# Patient Record
Sex: Male | Born: 1935 | Race: White | Hispanic: No | Marital: Married | Smoking: Former smoker
Health system: Southern US, Community
[De-identification: ages and names within clinical notes are randomized; demographics above are authoritative.]

## PROBLEM LIST (undated history)

## (undated) DIAGNOSIS — E119 Type 2 diabetes mellitus without complications: Secondary | ICD-10-CM

## (undated) DIAGNOSIS — I10 Essential (primary) hypertension: Secondary | ICD-10-CM

## (undated) HISTORY — PX: CORONARY ARTERY BYPASS GRAFT: SHX141

## (undated) HISTORY — PX: CHOLECYSTECTOMY: SHX55

## (undated) HISTORY — PX: BACK SURGERY: SHX140

## (undated) HISTORY — PX: CARDIAC DEFIBRILLATOR PLACEMENT: SHX171

---

## 2008-08-23 ENCOUNTER — Ambulatory Visit: Payer: Self-pay | Admitting: Occupational Medicine

## 2009-02-16 DIAGNOSIS — E119 Type 2 diabetes mellitus without complications: Secondary | ICD-10-CM | POA: Insufficient documentation

## 2013-09-03 DIAGNOSIS — I4891 Unspecified atrial fibrillation: Secondary | ICD-10-CM | POA: Insufficient documentation

## 2015-09-13 DIAGNOSIS — J301 Allergic rhinitis due to pollen: Secondary | ICD-10-CM | POA: Insufficient documentation

## 2015-10-19 ENCOUNTER — Emergency Department
Admission: EM | Admit: 2015-10-19 | Discharge: 2015-10-19 | Payer: Medicare HMO | Source: Home / Self Care | Attending: Family Medicine | Admitting: Family Medicine

## 2015-10-19 ENCOUNTER — Encounter: Payer: Self-pay | Admitting: *Deleted

## 2015-10-19 ENCOUNTER — Emergency Department (INDEPENDENT_AMBULATORY_CARE_PROVIDER_SITE_OTHER): Payer: Medicare HMO

## 2015-10-19 DIAGNOSIS — I5022 Chronic systolic (congestive) heart failure: Secondary | ICD-10-CM | POA: Insufficient documentation

## 2015-10-19 DIAGNOSIS — R0902 Hypoxemia: Secondary | ICD-10-CM

## 2015-10-19 DIAGNOSIS — W19XXXA Unspecified fall, initial encounter: Secondary | ICD-10-CM

## 2015-10-19 DIAGNOSIS — S2242XA Multiple fractures of ribs, left side, initial encounter for closed fracture: Secondary | ICD-10-CM | POA: Diagnosis not present

## 2015-10-19 DIAGNOSIS — D696 Thrombocytopenia, unspecified: Secondary | ICD-10-CM | POA: Insufficient documentation

## 2015-10-19 DIAGNOSIS — R2681 Unsteadiness on feet: Secondary | ICD-10-CM | POA: Insufficient documentation

## 2015-10-19 HISTORY — DX: Type 2 diabetes mellitus without complications: E11.9

## 2015-10-19 HISTORY — DX: Essential (primary) hypertension: I10

## 2015-10-19 MED ORDER — IPRATROPIUM-ALBUTEROL 0.5-2.5 (3) MG/3ML IN SOLN
3.0000 mL | Freq: Once | RESPIRATORY_TRACT | Status: AC
  Administered 2015-10-19: 3 mL via RESPIRATORY_TRACT

## 2015-10-19 NOTE — ED Notes (Signed)
Lost his balance this am in bathroom hit the toilet hurting left ribs, mid-back left arm and hand. Crawled to bed called son who came over and helped him up.

## 2015-10-19 NOTE — ED Provider Notes (Signed)
CSN: [id number]     Arrival date & time 10/19/15  1424 History   First MD Initiated Contact with Patient 10/19/15 1432     Chief Complaint  Patient presents with  . Fall   (Consider location/radiation/quality/duration/timing/severity/associated sxs/prior Treatment) HPI The pt is an 82yo male brought to [facility] by his wife and granddaughter with c/o Left sided rib and back pain after trip and fall this morning while in the bathroom. Pt states he was looking out the window and lost his balance while taking a step back. He fell between the sink and the toilet, hitting his ribs on the toilet. Denies hitting his head or LOC. He was able to crawl to his bed and called his son who came over to help. Pain is aching and sharp, 5/10, worse with palpation, certain movements and deep inspiration.  He has a hx of CHF but denies SOB, leg swelling, or recent weight gain. Denies anterior chest pain.  He is not on oxygen at home. He does have a defibrillator in place.    O2 Sat noted to be 89% on RA.  Pt denies SOB and states he does not use oxygen at home.   Cardiologist: [facility]- Dr. Landry Corporal  Past Medical History  Diagnosis Date  . Diabetes mellitus without complication (HCC)   . Hypertension    Past Surgical History  Procedure Laterality Date  . Back surgery    . Cholecystectomy    . Cardiac defibrillator placement    . Coronary artery bypass graft     History reviewed. No pertinent family history. Social History  Substance Use Topics  . Smoking status: Former Games developer  . Smokeless tobacco: None  . Alcohol Use: No    Review of Systems  Respiratory: Positive for wheezing ( chronic). Negative for cough, chest tightness and shortness of breath.   Cardiovascular: Positive for chest pain (Left side/ribs). Negative for palpitations and leg swelling.  Gastrointestinal: Negative for nausea, vomiting and abdominal pain.  Musculoskeletal: Positive for myalgias, back pain (Right middle/upper) and gait  problem (chronic, unsteady gait). Negative for joint swelling, neck pain and neck stiffness.  Skin: Positive for color change. Negative for wound.    Allergies  Codeine and Penicillins  Home Medications   Prior to Admission medications   Medication Sig Start Date End Date Taking? Authorizing Provider  amiodarone (PACERONE) 200 MG tablet Take 200 mg by mouth daily.   Yes Historical Provider, MD  aspirin EC 81 MG tablet Take 81 mg by mouth daily.   Yes Historical Provider, MD  Cyanocobalamin (VITAMIN B12 PO) Take 1,000 Units by mouth.   Yes Historical Provider, MD  donepezil (ARICEPT) 5 MG tablet Take 5 mg by mouth at bedtime.   Yes Historical Provider, MD  escitalopram (LEXAPRO) 10 MG tablet Take 10 mg by mouth daily.   Yes Historical Provider, MD  furosemide (LASIX) 80 MG tablet Take 80 mg by mouth.   Yes Historical Provider, MD  insulin glargine (LANTUS) 100 UNIT/ML injection Inject 30 Units into the skin at bedtime.   Yes Historical Provider, MD  levothyroxine (SYNTHROID) 88 MCG tablet Take 88 mcg by mouth daily before breakfast.   Yes Historical Provider, MD  metoprolol (LOPRESSOR) 100 MG tablet Take 100 mg by mouth 2 (two) times daily.   Yes Historical Provider, MD  VITAMIN D, CHOLECALCIFEROL, PO Take 1,000 Units by mouth.   Yes Historical Provider, MD   Meds Ordered and Administered this Visit   Medications  ipratropium-albuterol (  DUONEB) 0.5-2.5 (3) MG/3ML nebulizer solution 3 mL (3 mLs Nebulization Given 10/19/15 1607)    BP 132/72 mmHg  Pulse 60  Temp(Src) 97.8 F (36.6 C) (Oral)  Ht 6' (1.829 m)  Wt 204 lb (92.534 kg)  BMI 27.66 kg/m2  SpO2 89% No data found.   Physical Exam  Constitutional: He is oriented to person, place, and time. He appears well-developed and well-nourished.  Elderly male sitting in a wheelchair, NAD  HENT:  Head: Normocephalic and atraumatic.  Mouth/Throat: Oropharynx is clear and moist.  Eyes: Conjunctivae and EOM are normal. Pupils are  equal, round, and reactive to light. No scleral icterus.  Neck: Normal range of motion.  Cardiovascular: Normal rate, regular rhythm and normal heart sounds.   Pulmonary/Chest: Effort normal. No respiratory distress. He has decreased breath sounds. He has wheezes. He has rales in the right lower field and the left lower field.   He exhibits tenderness.    Decreased lung sounds throughout with expiratory wheeze and faint rales in lower lung fields. No respiratory distress, able to speak in full sentences, no accessory muscle use.  Abdominal: Soft. He exhibits no distension. There is no tenderness.  Musculoskeletal: Normal range of motion.  Neurological: He is alert and oriented to person, place, and time.  Skin: Skin is warm and dry.  Nursing note and vitals reviewed.   ED Course  Procedures (including critical care time)  Labs Review Labs Reviewed - No data to display  Imaging Review Dg Ribs Unilateral W/chest Left  10/19/2015  CLINICAL DATA:  Fall this morning. Left lateral lower chest wall pain. EXAM: LEFT RIBS AND CHEST - 3+ VIEW COMPARISON:  None. FINDINGS: There is a nondisplaced fracture of the left lateral ninth and tenth ribs. There is a probable additional nondisplaced fracture of the left posterior lateral eleventh rib. No other fractures.  Bones are demineralized. Mild left lung base atelectasis.  There is Cardiac there are prominent interstitial and bronchovascular markings bilaterally, presumed chronic. No convincing pleural effusion. No pneumothorax. Changes from CABG surgery. Left anterior chest wall AICD is well positioned. IMPRESSION: 1. Nondisplaced fractures of the left lateral ninth and tenth ribs and possibly of the posterior, lateral eleventh rib. 2. No evidence of a fracture complication. No lung contusion, pleural effusion or pneumothorax. Electronically Signed   By: Amie Portland M.D.   On: 10/19/2015 15:26     MDM   1. Fracture three ribs-closed, left, initial  encounter   2. Hypoxia     Pt is an 82yo male with hx of CHF presenting to [facility] with Left side and back Rib pain.  No head injury or LOC.  O2 Sat is 89% on RA in triage. Pt does not typically have O2 at home. Denies leg swelling or increased weight gain.  Pt declined pain medication and ice pack in UC.   CXR: c/w nondisplaced fractures of Left lateral 9th and 10th ribs and possibly posterior lateral 11th rib (based on exam, agree with fracture of 11th rib.   No lung contusion, pleural effusion or pneumothorax.  Pt given duoneb and several minutes of O2 via nasal canula on 3%.  Lungs sounds more "wet" after duoneb. Pt afraid to cough due to pain but still declining Toradol, ibuprofen or acetaminophen.    Advised pt he would benefit from being hospitalized for monitoring and pain management due to number of ribs broken, lung sounds and O2 Sat on RA.  Pt and family agreeable with plan to have pt  go to emergency department.  Family prefers to stay in [place] if possible.   Consulted with Dr. Georgina PillionMassey due to O2 Sat staying around 89-90% on nasal canula, who also examined pt as O2 Sat remained in 80s on RA. Recommends pt be [facility] via [facility], however, pt [facility] as pt's cardiologist and records are at that facility.      Consulted with charge nurse at [facility], agrees pt is likely suitable for that facility, but can be transferred to [facility] if needed. EMS called to transport.  Pt stable for transport.     Junius Finnerrin O'Malley, PA-C 10/19/15 1721

## 2015-10-20 ENCOUNTER — Telehealth: Payer: Self-pay | Admitting: *Deleted

## 2015-11-03 DIAGNOSIS — Z95810 Presence of automatic (implantable) cardiac defibrillator: Secondary | ICD-10-CM | POA: Insufficient documentation

## 2015-11-03 DIAGNOSIS — E039 Hypothyroidism, unspecified: Secondary | ICD-10-CM | POA: Insufficient documentation

## 2015-11-03 DIAGNOSIS — Z8679 Personal history of other diseases of the circulatory system: Secondary | ICD-10-CM | POA: Insufficient documentation

## 2015-11-03 DIAGNOSIS — E1122 Type 2 diabetes mellitus with diabetic chronic kidney disease: Secondary | ICD-10-CM | POA: Insufficient documentation

## 2015-12-01 ENCOUNTER — Encounter (HOSPITAL_COMMUNITY): Payer: Self-pay | Admitting: Psychiatry

## 2015-12-01 ENCOUNTER — Ambulatory Visit (INDEPENDENT_AMBULATORY_CARE_PROVIDER_SITE_OTHER): Payer: Medicare HMO | Admitting: Psychiatry

## 2015-12-01 VITALS — BP 128/80 | HR 60

## 2015-12-01 DIAGNOSIS — F411 Generalized anxiety disorder: Secondary | ICD-10-CM

## 2015-12-01 DIAGNOSIS — F331 Major depressive disorder, recurrent, moderate: Secondary | ICD-10-CM | POA: Diagnosis not present

## 2015-12-01 DIAGNOSIS — F0630 Mood disorder due to known physiological condition, unspecified: Secondary | ICD-10-CM

## 2015-12-01 DIAGNOSIS — G4700 Insomnia, unspecified: Secondary | ICD-10-CM

## 2015-12-01 DIAGNOSIS — I10 Essential (primary) hypertension: Secondary | ICD-10-CM | POA: Insufficient documentation

## 2015-12-01 DIAGNOSIS — I259 Chronic ischemic heart disease, unspecified: Secondary | ICD-10-CM | POA: Insufficient documentation

## 2015-12-01 NOTE — Progress Notes (Signed)
Psychiatric Initial Adult Assessment   Patient Identification: Douglas Michael MRN:  [id number] Date of Evaluation:  12/01/2015 Referral Source: Alain Honey , MD Chief Complaint:   Chief Complaint    Establish Care     Visit Diagnosis:    ICD-9-CM ICD-10-CM   1. Moderate episode of recurrent major depressive disorder (HCC) 296.32 F33.1   2. GAD (generalized anxiety disorder) 300.02 F41.1   3. Insomnia 780.52 G47.00   4. Mood disorder in conditions classified elsewhere 293.83 F06.30     History of Present Illness:  82 years old currently married Caucasian male referred for evaluation of depression. He is here with his wife.  Patient suffers from multiple medical conditions including congestive heart failure atrial fibrillation immobility due to recent fall diabetes hypothyroidism. His depression is gone somewhat worse after he fell and he is on a wheelchair. He feels his depression is worse at evening but during the daytime he is able to keep himself busy or occupied. He has a very supportive wife. His depression started in 10/25/2011 when his son died Before that he was fairly active he has been working up to 2-1/2 years ago. He has been different medication but currently is on Lexapro and trazodone. Says according to his wife he has lost the more interest decreased activity but because of his medical condition and mobility he is limited and that his added more so to the depression. He worries, at times excessive he has difficulty sleeping. Modifying factors are; his supportive wife he has grandkids and great grandkids a lot of family members live nearby so they come and go and keeps his life busy and occupied He watches sports, ice hockey games and keeps himself busy with television and other activities  Severity of depression; 7 out of 10. At nighttime it  becomes  4.  Sleep : distrubed because of need of oxygen and mostly prefers to take naps in recliner or wheelchair.   Memory: remote  memory is fair, he does get forgetful at times have difficulty remembering names. He was oriented to the year and the month. He was able to recall 2 out of 3.  No history or recent symptoms of mania or psychosis.  Denies drug or alcohol use  Associated Signs/Symptoms: Depression Symptoms:  insomnia, difficulty concentrating, anxiety, (Hypo) Manic Symptoms:  Distractibility, Anxiety Symptoms:  Excessive Worry, Psychotic Symptoms:  denies PTSD Symptoms: NA  Past Psychiatric History: depression for last 5-7 years ago. Son's death, his retirement and medical complexity has contributed to his depression.   Previous Psychotropic Medications: Yes   Substance Abuse History in the last 12 months:  No.  Consequences of Substance Abuse: NA  Past Medical History:  Past Medical History  Diagnosis Date  . Diabetes mellitus without complication (HCC)   . Hypertension     Past Surgical History  Procedure Laterality Date  . Back surgery    . Cholecystectomy    . Cardiac defibrillator placement    . Coronary artery bypass graft      Family Psychiatric History: did not endorse   Family History: History reviewed. No pertinent family history.  Social History:   Social History   Social History  . Marital Status: Married    Spouse Name: N/A  . Number of Children: N/A  . Years of Education: N/A   Social History Main Topics  . Smoking status: Former Games developer  . Smokeless tobacco: None  . Alcohol Use: No  . Drug Use: No  . Sexual Activity:  Not Asked   Other Topics Concern  . None   Social History Narrative    Additional Social History: Grew up with his parents [name] growing up was good. He finished high school. 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 Married for the last 40+ years. He has kids grandkids and great grandkids. No legal issues  Allergies:   Allergies  Allergen  Reactions  . Codeine   . Penicillins   . Penicillin V Rash    Metabolic Disorder Labs: No results found for: HGBA1C, MPG No results found for: PROLACTIN No results found for: CHOL, TRIG, HDL, CHOLHDL, VLDL, LDLCALC   Current Medications: Current Outpatient Prescriptions  Medication Sig Dispense Refill  . amiodarone (PACERONE) 200 MG tablet Take 200 mg by mouth daily.    Marland Kitchen. aspirin EC 81 MG tablet Take 81 mg by mouth daily.    . Cyanocobalamin (VITAMIN B12 PO) Take 1,000 Units by mouth.    . donepezil (ARICEPT) 5 MG tablet Take 5 mg by mouth at bedtime.    Marland Kitchen. escitalopram (LEXAPRO) 10 MG tablet Take 10 mg by mouth daily.    . furosemide (LASIX) 80 MG tablet Take 80 mg by mouth.    . insulin glargine (LANTUS) 100 UNIT/ML injection Inject 30 Units into the skin at bedtime.    . metoprolol (LOPRESSOR) 100 MG tablet Take 100 mg by mouth 2 (two) times daily.    Marland Kitchen. VITAMIN D, CHOLECALCIFEROL, PO Take 1,000 Units by mouth.    . levothyroxine (SYNTHROID) 88 MCG tablet Take 88 mcg by mouth daily before breakfast. Reported on 12/01/2015     No current facility-administered medications for this visit.    Neurologic: Headache: No Seizure: No Paresthesias:Yes  Musculoskeletal: Strength & Muscle Tone: abnormal Gait & Station: unsteady Patient leans: in wheelchair  Psychiatric Specialty Exam: Review of Systems  Cardiovascular: Negative for chest pain.  Musculoskeletal: Positive for myalgias.  Skin: Negative for rash.  Neurological: Negative for tremors.  Psychiatric/Behavioral: Positive for depression. Negative for suicidal ideas and substance abuse.    Blood pressure 128/80, pulse 60, SpO2 90 %.There is no weight on file to calculate BMI.  General Appearance: Casual  Eye Contact:  Fair  Speech:  Slow  Volume:  Decreased  Mood:  Dysphoric  Affect:  Full Range  Thought Process:  Goal Directed  Orientation:  Full (Time, Place, and Person)  Thought Content:  Logical  Suicidal  Thoughts:  No  Homicidal Thoughts:  No  Memory:  Immediate;   Fair Recent;   forgetful  Judgement:  Fair  Insight:  Fair  Psychomotor Activity:  Decreased  Concentration:  Concentration: Fair and Attention Span: Poor  Recall:  [employer]  Fund of Knowledge:Fair  Language: Fair  Akathisia:  Negative  Handed:  Right  AIMS (if indicated):    Assets:  Desire for Improvement Social Support  ADL's:  Impaired due to recent fall. He is on wheelchair  Cognition: Impaired,  Mild  Sleep:  variable    Treatment Plan Summary: Medication management and Plan as follows  Depression: May be related starting from his son's death. Medical complexity has contributed to it as well including current limitation and recent falls. He has a very supportive wife. His depression is more so during the evening and late hours of changed to Lexapro timing to afternoon but he can change back if needed. Currently his depression is  not severe he is able to manage in the daytime and remains optimistic keep himself busy No need to increase dose as he is tolerating it well and does not feel overhwelm or depressed during the day despite his limitations. Once he is able to be mobile and not on the wheelchair recommend keeping himself busy.   GAD: lexapro as above Sleep: continue trazadone. Avoid lying flat. Reviewed sleep hygiene Patient closely following with his other providers regarding his medical conditions. More than 50% time spent in counseling and coordination of care including patient education Call 911 or report local emergency room for any urgent concerns of suicidal thoughts  Thresa Ross, MD 6/9/201711:43 AM

## 2015-12-01 NOTE — Patient Instructions (Signed)
Depression and mood relavant to medical conditions, history of loss and currently immobility and limitations.  Handling well. Will change lexapro timing to afternoon as depressed more at night. Avoid lying flat on bed.  Reviewed sleep hygiene.

## 2015-12-14 ENCOUNTER — Ambulatory Visit (HOSPITAL_COMMUNITY): Payer: Medicare HMO | Admitting: Psychiatry

## 2015-12-19 ENCOUNTER — Encounter (HOSPITAL_COMMUNITY): Payer: Self-pay | Admitting: Psychiatry

## 2015-12-19 ENCOUNTER — Ambulatory Visit (INDEPENDENT_AMBULATORY_CARE_PROVIDER_SITE_OTHER): Payer: Medicare HMO | Admitting: Psychiatry

## 2015-12-19 VITALS — BP 122/76 | HR 62

## 2015-12-19 DIAGNOSIS — F0630 Mood disorder due to known physiological condition, unspecified: Secondary | ICD-10-CM

## 2015-12-19 DIAGNOSIS — F331 Major depressive disorder, recurrent, moderate: Secondary | ICD-10-CM | POA: Diagnosis not present

## 2015-12-19 DIAGNOSIS — F411 Generalized anxiety disorder: Secondary | ICD-10-CM | POA: Diagnosis not present

## 2015-12-19 DIAGNOSIS — G4700 Insomnia, unspecified: Secondary | ICD-10-CM | POA: Diagnosis not present

## 2015-12-19 NOTE — Progress Notes (Signed)
Patient ID: Douglas Michael, male   DOB: 07/01/1930, 82 y.o.   MRN: [id number]  [facility]   Patient Identification: Douglas Michael MRN:  [id number] Date of Evaluation:  12/19/2015 Referral Source: Alain Honeyichard Herber , MD Chief Complaint:   Chief Complaint    Follow-up     Visit Diagnosis:    ICD-9-CM ICD-10-CM   1. Moderate episode of recurrent major depressive disorder (HCC) 296.32 F33.1   2. Mood disorder in conditions classified elsewhere 293.83 F06.30   3. GAD (generalized anxiety disorder) 300.02 F41.1   4. Insomnia 780.52 G47.00     History of Present Illness:  82 years old currently married Caucasian male referred for evaluation of depression. He is here with his wife.  Patient suffers from multiple medical conditions including congestive heart failure atrial fibrillation immobility due to recent fall diabetes hypothyroidism.  Patient is already on Lexapro which is a time into afternoon. We talked about keeping him busy and keeping positivity that has helped he feels satisfied he feels his mood wise he is manageable. He understands his limitation of the medical health concerns.  Modifying factors are; his supportive wife he has grandkids and great grandkids a lot of family members live nearby so they come and go and keeps his life busy and occupied He watches sports, ice hockey games and keeps himself busy with television and other activities  Severity of depression; 7 out of 10.  Sleep : distrubed because of need of oxygen and mostly prefers to take naps in recliner or wheelchair.   Memory: remote memory is fair, he does get forgetful at times have difficulty remembering names. He was oriented to the year and the month. He was able to recall 2 out of 3.  No history or recent symptoms of mania or psychosis.  Denies drug or alcohol use  Associated Signs/Symptoms:  Anxiety Symptoms:  Excessive Worry, (not worsened) Psychotic Symptoms:  denies PTSD  Symptoms: NA  Past Psychiatric History: depression for last 5-7 years ago. Son's death, his retirement    Past Medical History:  Past Medical History  Diagnosis Date  . Diabetes mellitus without complication (HCC)   . Hypertension     Past Surgical History  Procedure Laterality Date  . Back surgery    . Cholecystectomy    . Cardiac defibrillator placement    . Coronary artery bypass graft      Family Psychiatric History: did not endorse   Family History: History reviewed. No pertinent family history.  Social History:   Social History   Social History  . Marital Status: Married    Spouse Name: N/A  . Number of Children: N/A  . Years of Education: N/A   Social History Main Topics  . Smoking status: Former Games developermoker  . Smokeless tobacco: None  . Alcohol Use: No  . Drug Use: No  . Sexual Activity: Not Asked   Other Topics Concern  . None   Social History Narrative     Allergies:   Allergies  Allergen Reactions  . Codeine   . Penicillins   . Penicillin V Rash    Metabolic Disorder Labs: No results found for: HGBA1C, MPG No results found for: PROLACTIN No results found for: CHOL, TRIG, HDL, CHOLHDL, VLDL, LDLCALC   Current Medications: Current Outpatient Prescriptions  Medication Sig Dispense Refill  . amiodarone (PACERONE) 200 MG tablet Take 200 mg by mouth daily.    Marland Kitchen. aspirin EC 81 MG tablet Take 81 mg by mouth  daily.    . Cyanocobalamin (VITAMIN B12 PO) Take 1,000 Units by mouth.    . donepezil (ARICEPT) 5 MG tablet Take 5 mg by mouth at bedtime.    Marland Kitchen. escitalopram (LEXAPRO) 10 MG tablet Take 10 mg by mouth daily.    . furosemide (LASIX) 80 MG tablet Take 80 mg by mouth.    . insulin glargine (LANTUS) 100 UNIT/ML injection Inject 30 Units into the skin at bedtime.    Marland Kitchen. levothyroxine (SYNTHROID) 88 MCG tablet Take 88 mcg by mouth daily before breakfast. Reported on 12/01/2015    . metoprolol (LOPRESSOR) 100 MG tablet Take 100 mg by mouth 2 (two) times  daily.    Marland Kitchen. VITAMIN D, CHOLECALCIFEROL, PO Take 1,000 Units by mouth.     No current facility-administered medications for this visit.    Neurologic: Headache: No Seizure: No Paresthesias:Yes  Musculoskeletal: Strength & Muscle Tone: abnormal Gait & Station: unsteady Patient leans: in wheelchair  Psychiatric Specialty Exam: Review of Systems  Cardiovascular: Negative for chest pain.  Musculoskeletal: Positive for myalgias.  Skin: Negative for rash.  Neurological: Negative for tingling and tremors.  Psychiatric/Behavioral: Negative for depression, suicidal ideas and substance abuse.    Blood pressure 122/76, pulse 62, SpO2 93 %.There is no weight on file to calculate BMI.  General Appearance: Casual  Eye Contact:  Fair  Speech:  Slow  Volume:  Decreased  Mood: less dysphoric  Affect:  Full Range  Thought Process:  Goal Directed  Orientation:  Full (Time, Place, and Person)  Thought Content:  Logical  Suicidal Thoughts:  No  Homicidal Thoughts:  No  Memory:  Immediate;   Fair Recent;   forgetful  Judgement:  Fair  Insight:  Fair  Psychomotor Activity:  Decreased  Concentration:  Concentration: Fair and Attention Span: Poor  Recall:  [employer]Fair  Fund of Knowledge:Fair  Language: Fair  Akathisia:  Negative  Handed:  Right  AIMS (if indicated):    Assets:  Desire for Improvement Social Support  ADL's:  Impaired due to recent fall. He is on wheelchair  Cognition: Impaired,  Mild  Sleep:  variable    Treatment Plan Summary: Medication management and Plan as follows  Depression: related to retirement and  Medical complexity and limitations He has a very supportive wife. Continue lexapro 10mg . Can call for refills. Says he has  He remains positive and despite his medical limitations tries to keep himeself distracted if feels down.   GAD: lexapro as above Sleep:Avoid lying flat. Reviewed sleep hygiene Patient closely following with his other providers regarding his  medical conditions. More than 50% time spent in counseling and coordination of care including patient education Call 911 or report local emergency room for any urgent concerns of suicidal thoughts  Time spent: 25 minutes  Gilmore LarocheAKHTAR, Wendle Kina, MD 6/27/20171:51 PM

## 2016-01-23 DEATH — deceased

## 2016-02-13 ENCOUNTER — Ambulatory Visit (HOSPITAL_COMMUNITY): Payer: Self-pay | Admitting: Psychiatry

## 2017-02-18 IMAGING — DX DG RIBS W/ CHEST 3+V*L*
3 series · 3 of 3 positions shown · non-contrast
Comparison: None.

CLINICAL DATA: Fall this morning. Left lateral lower chest wall
pain.

EXAM:
LEFT RIBS AND CHEST - 3+ VIEW

[chest pa]
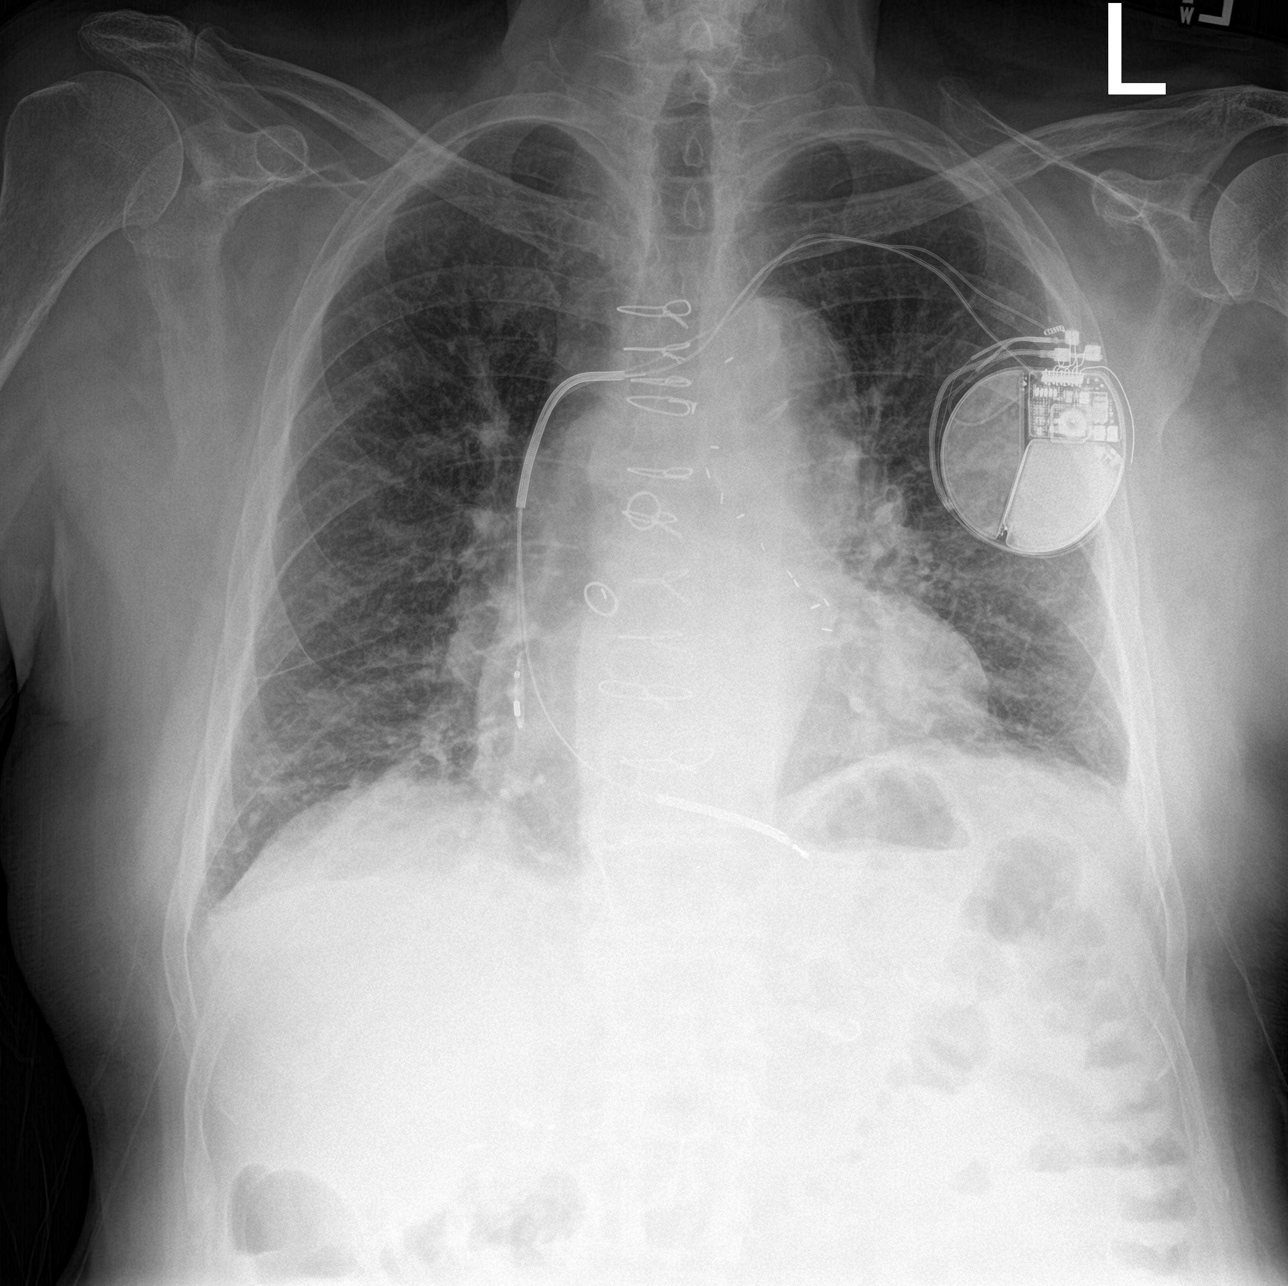

[rib ap]
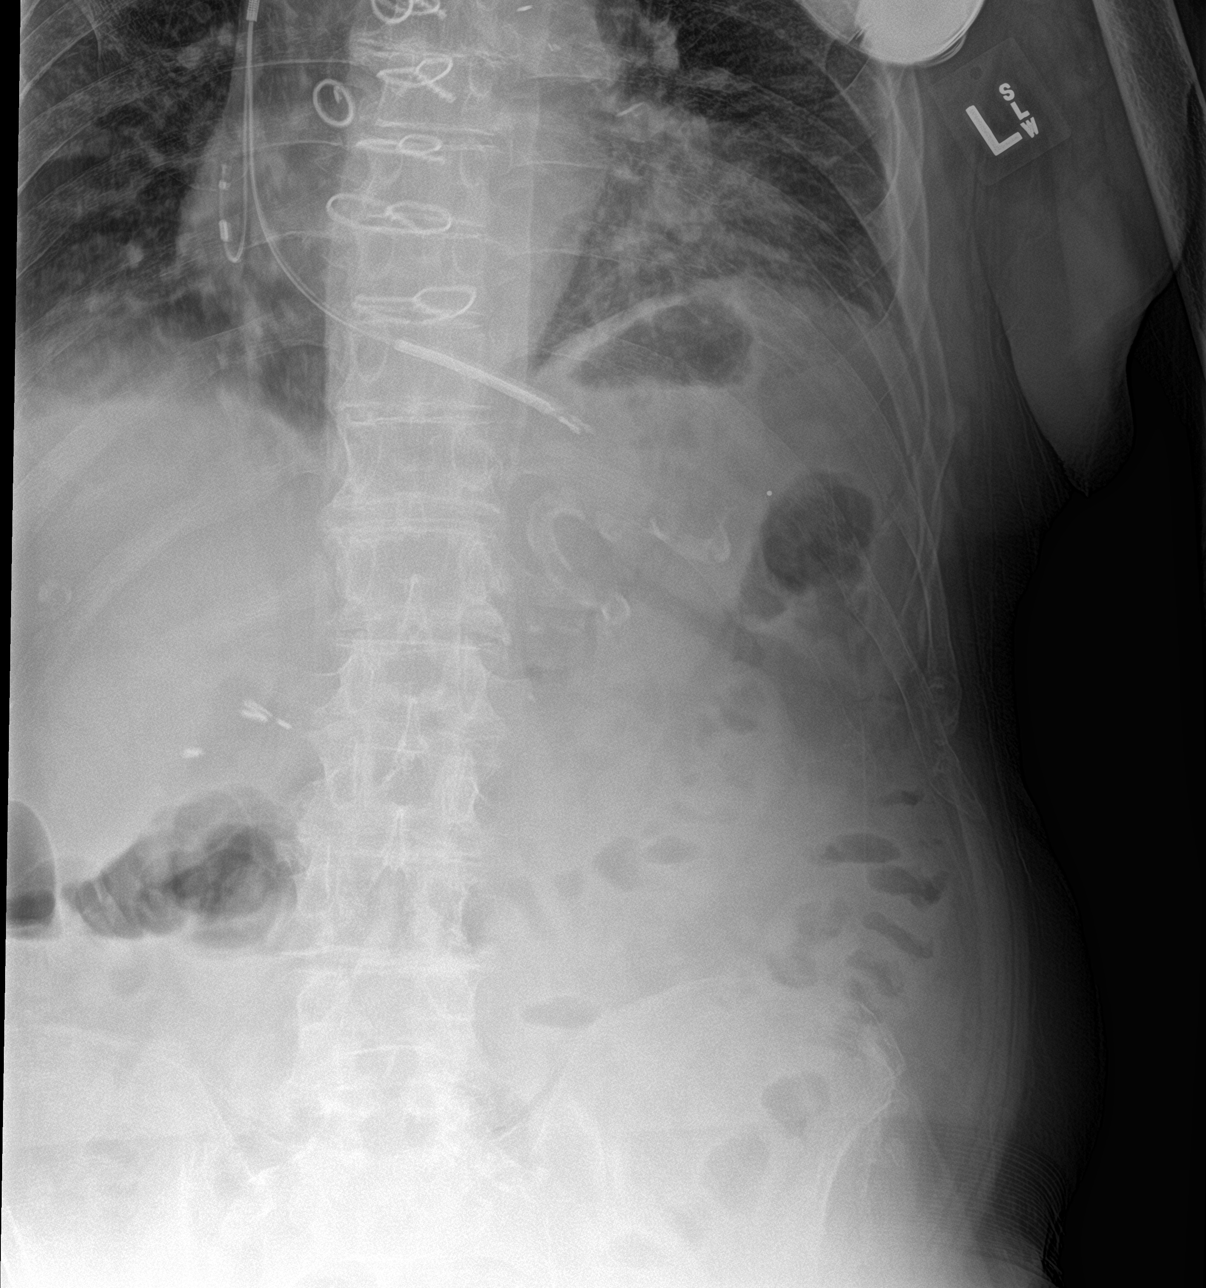

[rib pa obl]
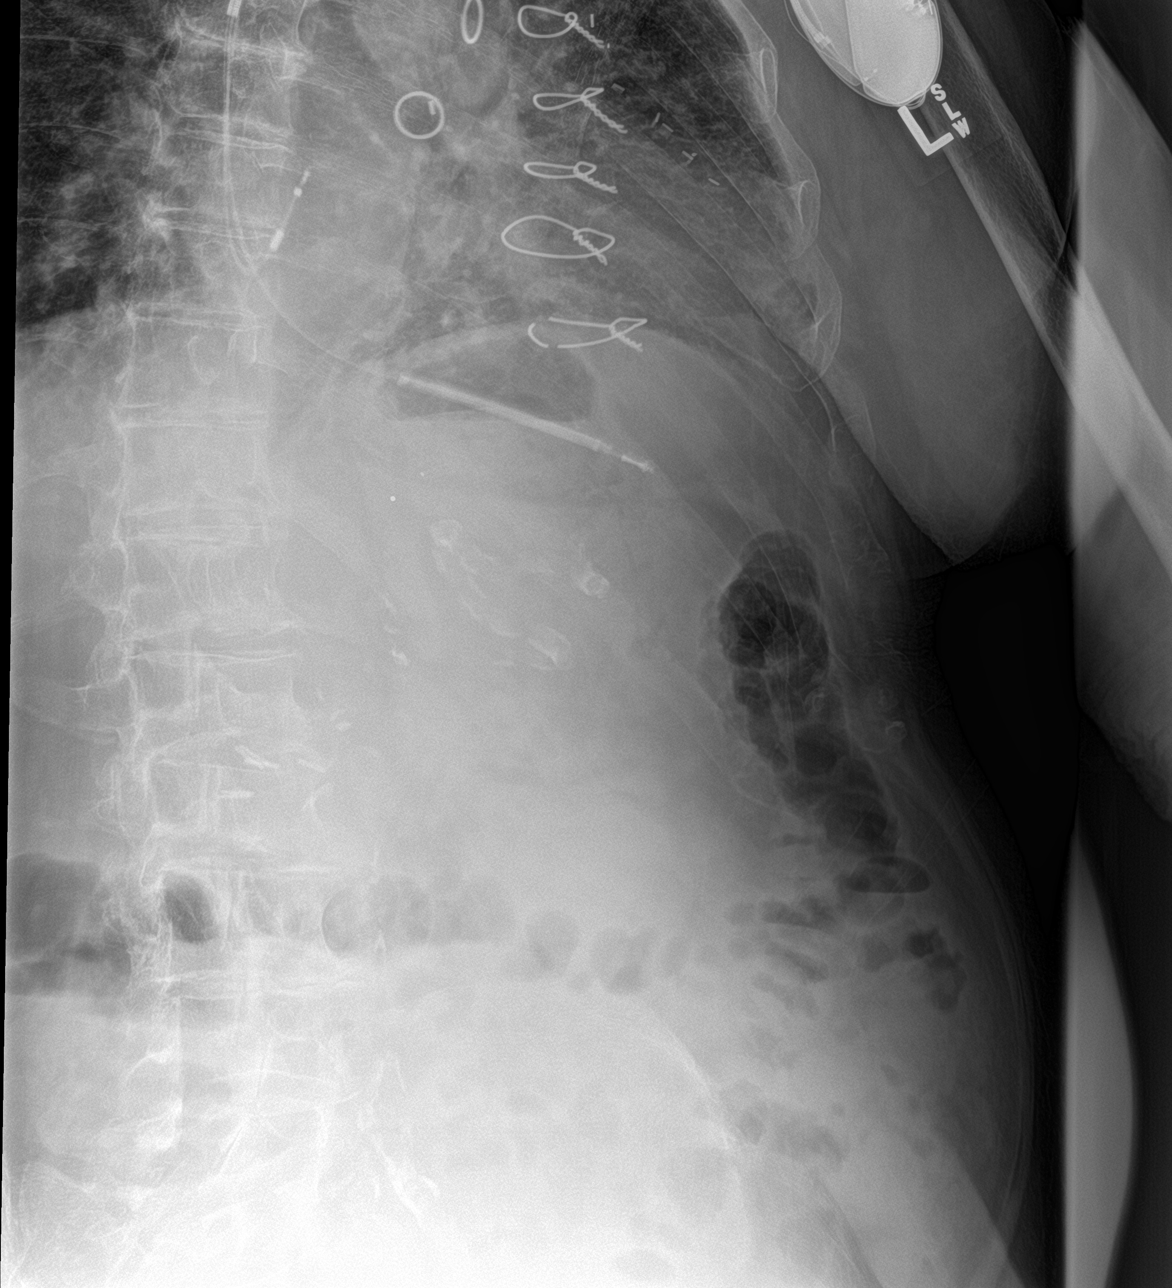

[3 of 3 positions shown; findings below may reference images not displayed]

FINDINGS: There is a nondisplaced fracture of the left lateral ninth and tenth
ribs. There is a probable additional nondisplaced fracture of the
left posterior lateral eleventh rib.

No other fractures.  Bones are demineralized.

Mild left lung base atelectasis.  There is

Cardiac there are prominent interstitial and bronchovascular
markings bilaterally, presumed chronic. No convincing pleural
effusion. No pneumothorax.

Changes from CABG surgery. Left anterior chest wall AICD is well
positioned.
IMPRESSION: 1. Nondisplaced fractures of the left lateral ninth and tenth ribs
and possibly of the posterior, lateral eleventh rib.
2. No evidence of a fracture complication. No lung contusion,
pleural effusion or pneumothorax.
